# Patient Record
Sex: Female | Born: 1965 | Race: White | Hispanic: No | Marital: Married | Smoking: Current every day smoker
Health system: Southern US, Community
[De-identification: ages and names within clinical notes are randomized; demographics above are authoritative.]

## PROBLEM LIST (undated history)

## (undated) DIAGNOSIS — H269 Unspecified cataract: Secondary | ICD-10-CM

## (undated) DIAGNOSIS — Z720 Tobacco use: Secondary | ICD-10-CM

## (undated) DIAGNOSIS — I10 Essential (primary) hypertension: Secondary | ICD-10-CM

## (undated) DIAGNOSIS — Z86010 Personal history of colonic polyps: Principal | ICD-10-CM

## (undated) HISTORY — PX: OTHER SURGICAL HISTORY: SHX169

## (undated) HISTORY — DX: Essential (primary) hypertension: I10

## (undated) HISTORY — DX: Unspecified cataract: H26.9

## (undated) HISTORY — DX: Tobacco use: Z72.0

## (undated) HISTORY — DX: Personal history of colonic polyps: Z86.010

---

## 1999-08-27 ENCOUNTER — Other Ambulatory Visit: Admission: RE | Admit: 1999-08-27 | Discharge: 1999-08-27 | Payer: Self-pay | Admitting: Gynecology

## 2007-06-05 ENCOUNTER — Other Ambulatory Visit: Admission: RE | Admit: 2007-06-05 | Discharge: 2007-06-05 | Payer: Self-pay | Admitting: Gynecology

## 2007-06-07 ENCOUNTER — Ambulatory Visit (HOSPITAL_COMMUNITY): Admission: RE | Admit: 2007-06-07 | Discharge: 2007-06-07 | Payer: Self-pay | Admitting: Gynecology

## 2007-06-11 ENCOUNTER — Encounter: Admission: RE | Admit: 2007-06-11 | Discharge: 2007-06-11 | Payer: Self-pay | Admitting: Gynecology

## 2007-11-20 ENCOUNTER — Encounter: Admission: RE | Admit: 2007-11-20 | Discharge: 2007-11-20 | Payer: Self-pay | Admitting: Gynecology

## 2009-07-01 IMAGING — MG MM DIAGNOSTIC UNILATERAL L
4 series · 4 of 4 positions shown · non-contrast
Comparison: none

DG DIAGNOSTIC UNILATERAL L
CC and MLO view(s) were taken of the left breast.
Technologist: Hoossen Dwa

DIGITAL UNILATERAL LEFT DIAGNOSTIC MAMMOGRAM WITH CAD:
CLINICAL DATA: 41-year-old for six-month follow-up of calcifications left breast.

[L CC (1 of 2)]
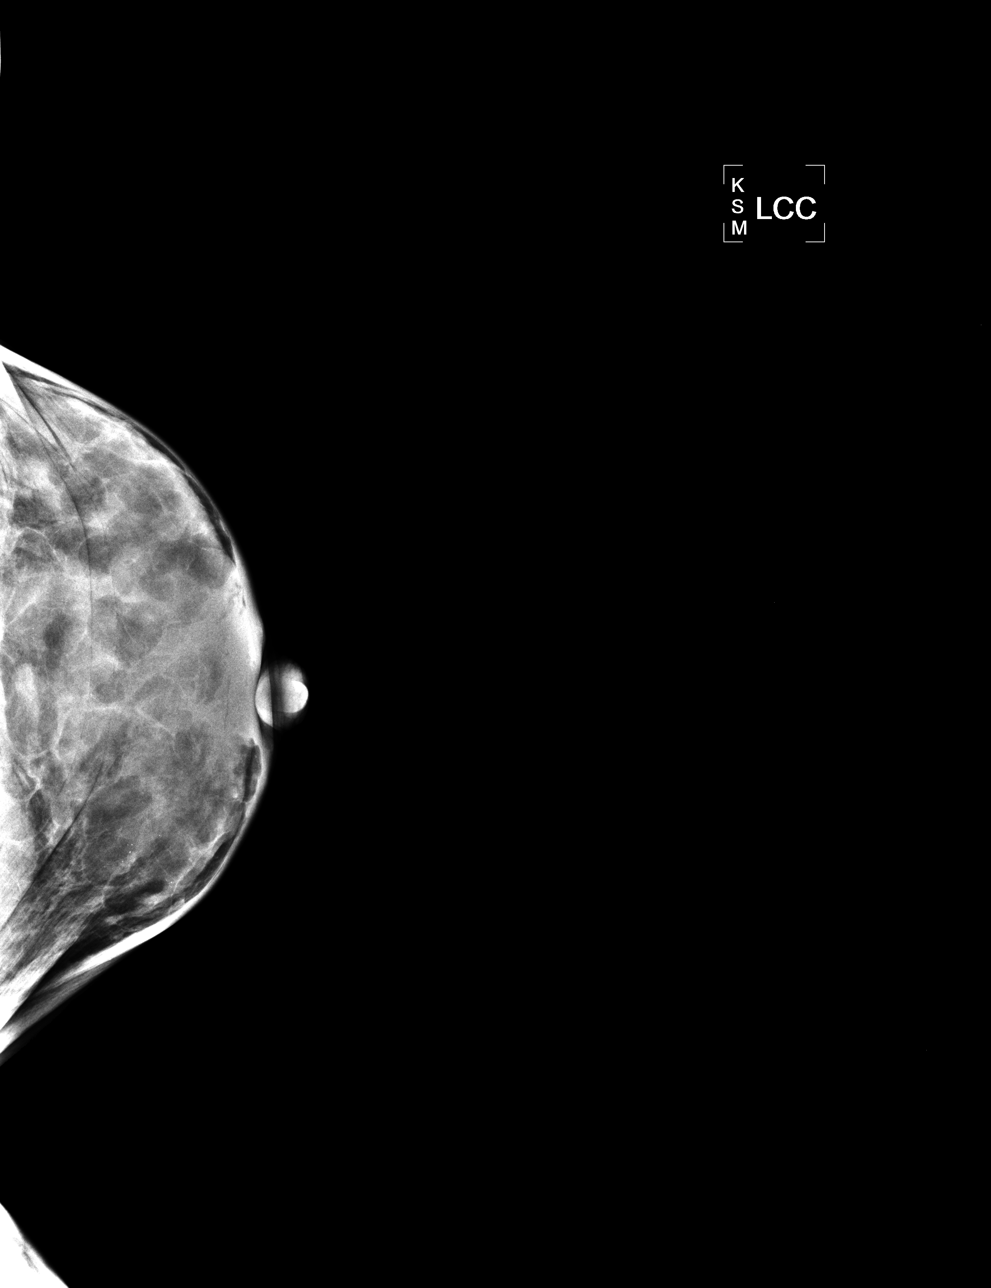

[L MLO]
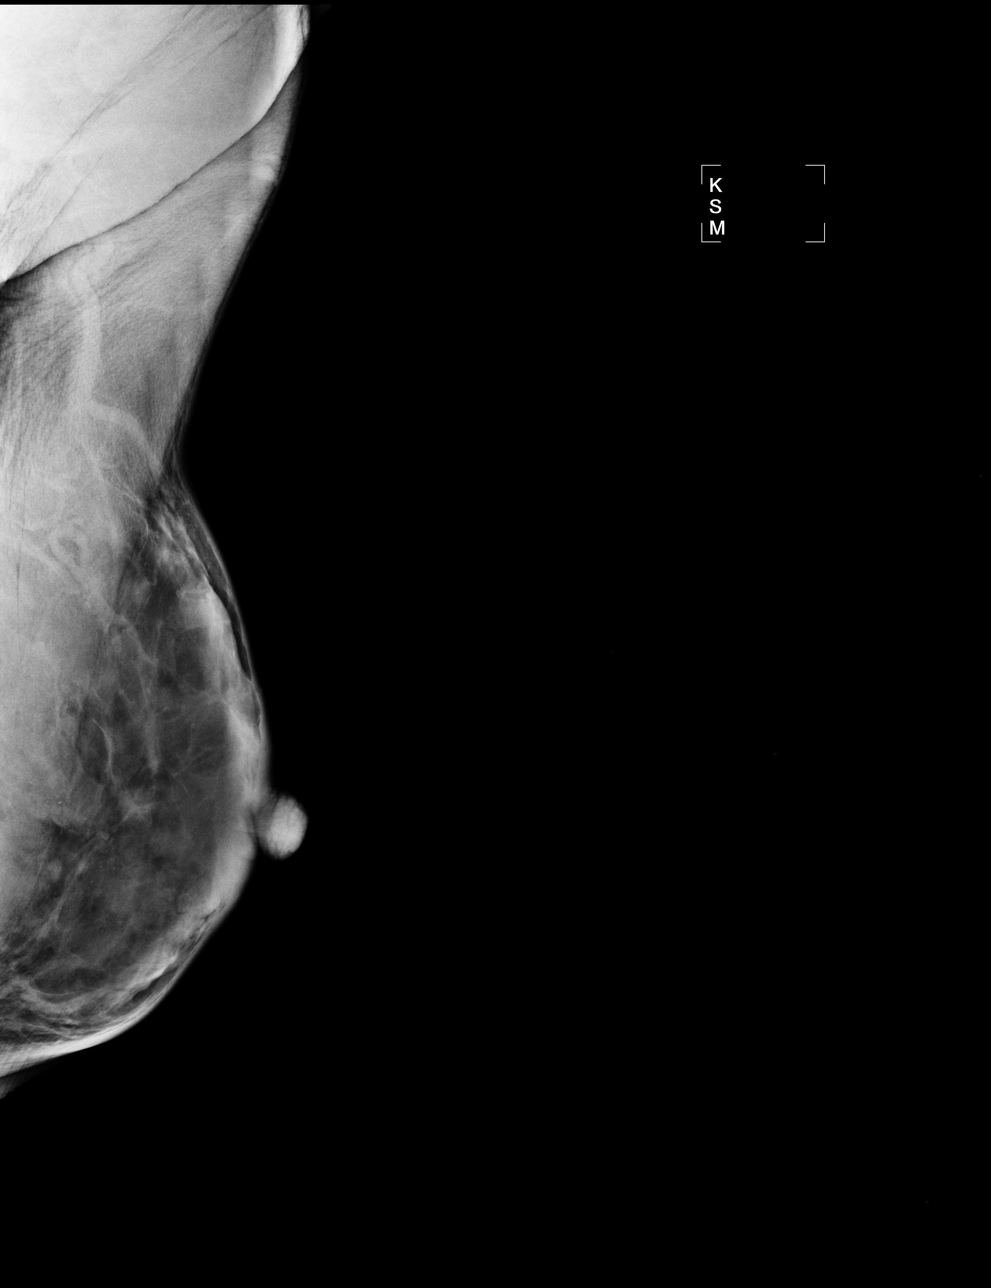

[L CC (2 of 2)]
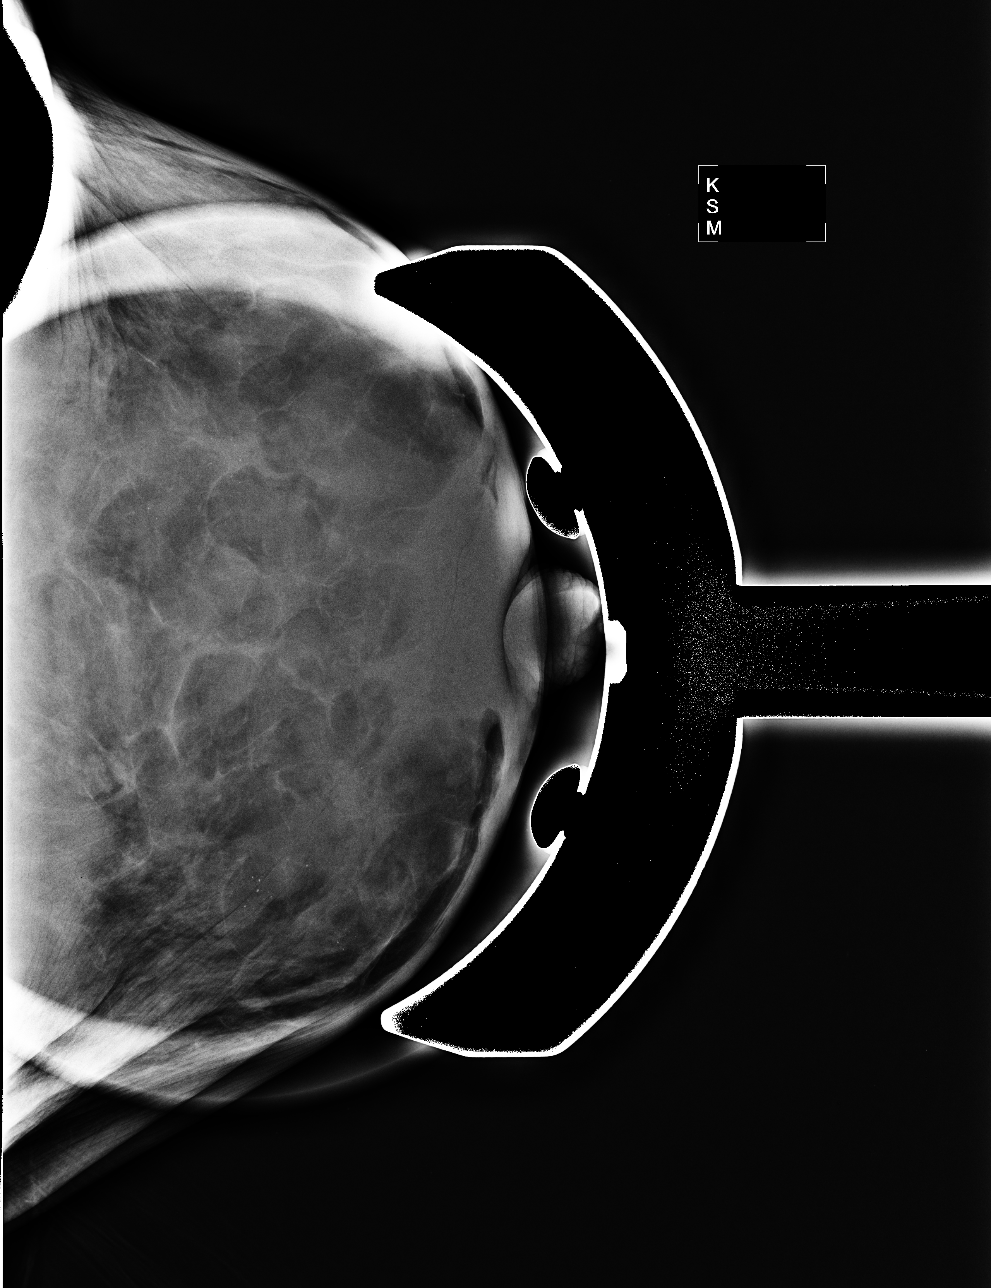

[L ML]
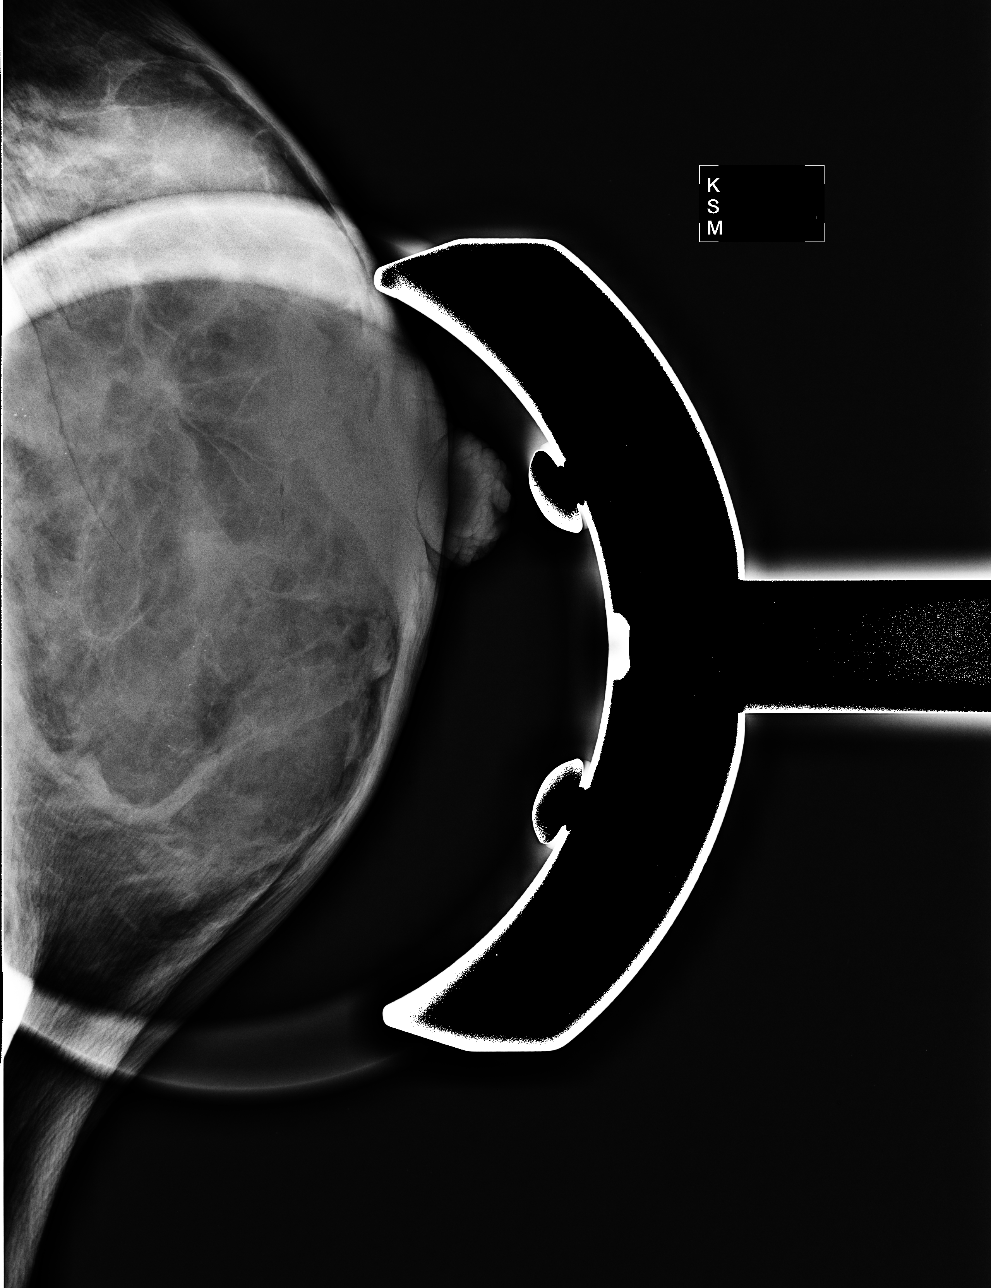

[4 of 4 positions shown; findings below may reference images not displayed]

Standard and magnified views were performed of the left breast showing extremely dense 
fibroglandular tissue.  Magnified views are performed of calcifications in the medial aspect of the
breast.  These calcifications are scattered and benign in appearance, stable since the prior 
study.  No suspicious calcifications or distribution of calcifications identified.
IMPRESSION: Calcifications on the left are consistent with benign process. Bilateral diagnostic is suggested in
May 2008 to complete the patient's full year of follow-up.

ASSESSMENT: Probably benign - BI-RADS 3

ANALYZED BY COMPUTER AIDED DETECTION. ,

## 2019-01-30 ENCOUNTER — Encounter: Payer: Self-pay | Admitting: Internal Medicine

## 2019-02-01 ENCOUNTER — Ambulatory Visit (AMBULATORY_SURGERY_CENTER): Payer: Self-pay | Admitting: *Deleted

## 2019-02-01 ENCOUNTER — Other Ambulatory Visit: Payer: Self-pay

## 2019-02-01 VITALS — Ht 64.0 in | Wt 112.0 lb

## 2019-02-01 DIAGNOSIS — Z1211 Encounter for screening for malignant neoplasm of colon: Secondary | ICD-10-CM

## 2019-02-01 NOTE — Progress Notes (Signed)
No egg or soy allergy known to patient  No issues with past sedation with any surgeries  or procedures, no intubation problems  No diet pills per patient No home 02 use per patient  No blood thinners per patient  Pt states she does have some problems with constipation , takes exlax and has results, takes once weekly. Patient stating she will take an exlax on Wednesday prior to Friday procedure. No A fib or A flutter  EMMI video offered and decline

## 2019-02-15 ENCOUNTER — Ambulatory Visit (AMBULATORY_SURGERY_CENTER): Payer: PRIVATE HEALTH INSURANCE | Admitting: Internal Medicine

## 2019-02-15 ENCOUNTER — Encounter: Payer: Self-pay | Admitting: Internal Medicine

## 2019-02-15 ENCOUNTER — Other Ambulatory Visit: Payer: Self-pay

## 2019-02-15 VITALS — BP 149/73 | HR 58 | Temp 99.3°F | Resp 16 | Ht 64.0 in | Wt 112.0 lb

## 2019-02-15 DIAGNOSIS — Z1211 Encounter for screening for malignant neoplasm of colon: Secondary | ICD-10-CM

## 2019-02-15 DIAGNOSIS — D123 Benign neoplasm of transverse colon: Secondary | ICD-10-CM | POA: Diagnosis not present

## 2019-02-15 MED ORDER — SODIUM CHLORIDE 0.9 % IV SOLN
500.0000 mL | Freq: Once | INTRAVENOUS | Status: DC
Start: 2019-02-15 — End: 2019-02-15

## 2019-02-15 NOTE — Progress Notes (Signed)
A/ox3, pleased with MAC, report to RN 

## 2019-02-15 NOTE — Op Note (Signed)
[employer] Patient Name: Bridget Hawkins Procedure Date: 02/15/2019 1:33 PM MRN: [id number] Endoscopist: Gatha Mayer , MD Age: 53 Referring MD:  Date of Birth: 11-29-1966 Gender: Female Account #: [id number] Procedure:                Colonoscopy Indications:              Screening for colorectal malignant neoplasm, This                            is the patient's first colonoscopy Medicines:                Propofol per Anesthesia, Monitored Anesthesia Care Procedure:                Pre-Anesthesia Assessment:                           - Prior to the procedure, a History and Physical                            was performed, and patient medications and                            allergies were reviewed. The patient's tolerance of                            previous anesthesia was also reviewed. The risks                            and benefits of the procedure and the sedation                            options and risks were discussed with the patient.                            All questions were answered, and informed consent                            was obtained. Prior Anticoagulants: The patient has                            taken no previous anticoagulant or antiplatelet                            agents. ASA Grade Assessment: II - A patient with                            mild systemic disease. After reviewing the risks                            and benefits, the patient was deemed in                            satisfactory condition to undergo the procedure.  After obtaining informed consent, the colonoscope                            was passed under direct vision. Throughout the                            procedure, the patient's blood pressure, pulse, and                            oxygen saturations were monitored continuously. The                            Model PCF-[id number] scope was introduced   through the anus and advanced to the the cecum,                            identified by appendiceal orifice and ileocecal                            valve. The colonoscopy was performed without                            difficulty. The patient tolerated the procedure                            well. The quality of the bowel preparation was                            good. The bowel preparation used was Miralax. The                            ileocecal valve, appendiceal orifice, and rectum                            were photographed. Scope In: 1:40:25 PM Scope Out: 1:55:53 PM Scope Withdrawal Time: 0 hours 11 minutes 46 seconds  Total Procedure Duration: 0 hours 15 minutes 28 seconds  Findings:                 The perianal and digital rectal examinations were                            normal.                           A 3 mm polyp was found in the transverse colon. The                            polyp was sessile. The polyp was removed with a                            cold snare. Resection and retrieval were complete.                            Verification of patient identification  for the                            specimen was done. Estimated blood loss was minimal.                           The exam was otherwise without abnormality on                            direct and retroflexion views. Complications:            No immediate complications. Estimated Blood Loss:     Estimated blood loss was minimal. Impression:               - One 3 mm polyp in the transverse colon, removed                            with a cold snare. Resected and retrieved.                           - The examination was otherwise normal on direct                            and retroflexion views. Recommendation:           - Patient has a contact number available for                            emergencies. The signs and symptoms of potential                            delayed complications were discussed  with the                            patient. Return to normal activities tomorrow.                            Written discharge instructions were provided to the                            patient.                           - Resume previous diet.                           - Repeat colonoscopy is recommended. The                            colonoscopy date will be determined after pathology                            results from today's exam become available for                            review. Gatha Mayer, MD 02/15/2019 2:06:07 PM This report  has been signed electronically.

## 2019-02-15 NOTE — Progress Notes (Signed)
Called to room to assist during endoscopic procedure.  Patient ID and intended procedure confirmed with present staff. Received instructions for my participation in the procedure from the performing physician.  

## 2019-02-15 NOTE — Progress Notes (Signed)
Pt's states no medical or surgical changes since previsit or office visit. 

## 2019-02-15 NOTE — Patient Instructions (Addendum)
I found and removed one tiny polyp.  I will let you know pathology results and when to have another routine colonoscopy by mail and/or My Chart.  I appreciate the opportunity to care for you. Gatha Mayer, MD, FACG YOU HAD AN ENDOSCOPIC PROCEDURE TODAY AT [facility]:   Refer to the procedure report that was given to you for any specific questions about what was found during the examination.  If the procedure report does not answer your questions, please call your gastroenterologist to clarify.  If you requested that your care partner not be given the details of your procedure findings, then the procedure report has been included in a sealed envelope for you to review at your convenience later.  YOU SHOULD EXPECT: Some feelings of bloating in the abdomen. Passage of more gas than usual.  Walking can help get rid of the air that was put into your GI tract during the procedure and reduce the bloating. If you had a lower endoscopy (such as a colonoscopy or flexible sigmoidoscopy) you may notice spotting of blood in your stool or on the toilet paper. If you underwent a bowel prep for your procedure, you may not have a normal bowel movement for a few days.  Please Note:  You might notice some irritation and congestion in your nose or some drainage.  This is from the oxygen used during your procedure.  There is no need for concern and it should clear up in a day or so.  SYMPTOMS TO REPORT IMMEDIATELY:   Following lower endoscopy (colonoscopy or flexible sigmoidoscopy):  Excessive amounts of blood in the stool  Significant tenderness or worsening of abdominal pains  Swelling of the abdomen that is new, acute  Fever of 100F or higher   Following upper endoscopy (EGD)  Vomiting of blood or coffee ground material  New chest pain or pain under the shoulder blades  Painful or persistently difficult swallowing  New shortness of breath  Fever of 100F or higher  Black,  tarry-looking stools  For urgent or emergent issues, a gastroenterologist can be reached at any hour by calling [phone].   DIET:  We do recommend a small meal at first, but then you may proceed to your regular diet.  Drink plenty of fluids but you should avoid alcoholic beverages for 24 hours.  ACTIVITY:  You should plan to take it easy for the rest of today and you should NOT DRIVE or use heavy machinery until tomorrow (because of the sedation medicines used during the test).    FOLLOW UP: Our staff will call the number listed on your records the next business day following your procedure to check on you and address any questions or concerns that you may have regarding the information given to you following your procedure. If we do not reach you, we will leave a message.  However, if you are feeling well and you are not experiencing any problems, there is no need to return our call.  We will assume that you have returned to your regular daily activities without incident.  If any biopsies were taken you will be contacted by phone or by letter within the next 1-3 weeks.  Please call us at [phone] if you have not heard about the biopsies in 3 weeks.    SIGNATURES/CONFIDENTIALITY: You and/or your care partner have signed paperwork which will be entered into your electronic medical record.  These signatures attest to the fact that that  the information above on your After Visit Summary has been reviewed and is understood.  Full responsibility of the confidentiality of this discharge information lies with you and/or your care-partner.YOU HAD AN ENDOSCOPIC PROCEDURE TODAY AT [facility]:   Refer to the procedure report that was given to you for any specific questions about what was found during the examination.  If the procedure report does not answer your questions, please call your gastroenterologist to clarify.  If you requested that your care partner not be given the  details of your procedure findings, then the procedure report has been included in a sealed envelope for you to review at your convenience later.  YOU SHOULD EXPECT: Some feelings of bloating in the abdomen. Passage of more gas than usual.  Walking can help get rid of the air that was put into your GI tract during the procedure and reduce the bloating. If you had a lower endoscopy (such as a colonoscopy or flexible sigmoidoscopy) you may notice spotting of blood in your stool or on the toilet paper. If you underwent a bowel prep for your procedure, you may not have a normal bowel movement for a few days.  Please Note:  You might notice some irritation and congestion in your nose or some drainage.  This is from the oxygen used during your procedure.  There is no need for concern and it should clear up in a day or so.  SYMPTOMS TO REPORT IMMEDIATELY:   Following lower endoscopy (colonoscopy or flexible sigmoidoscopy):  Excessive amounts of blood in the stool  Significant tenderness or worsening of abdominal pains  Swelling of the abdomen that is new, acute  Fever of 100F or higher   Following upper endoscopy (EGD)  Vomiting of blood or coffee ground material  New chest pain or pain under the shoulder blades  Painful or persistently difficult swallowing  New shortness of breath  Fever of 100F or higher  Black, tarry-looking stools  For urgent or emergent issues, a gastroenterologist can be reached at any hour by calling [phone].   DIET:  We do recommend a small meal at first, but then you may proceed to your regular diet.  Drink plenty of fluids but you should avoid alcoholic beverages for 24 hours.  ACTIVITY:  You should plan to take it easy for the rest of today and you should NOT DRIVE or use heavy machinery until tomorrow (because of the sedation medicines used during the test).    FOLLOW UP: Our staff will call the number listed on your records the next business day  following your procedure to check on you and address any questions or concerns that you may have regarding the information given to you following your procedure. If we do not reach you, we will leave a message.  However, if you are feeling well and you are not experiencing any problems, there is no need to return our call.  We will assume that you have returned to your regular daily activities without incident.  If any biopsies were taken you will be contacted by phone or by letter within the next 1-3 weeks.  Please call us at [phone] if you have not heard about the biopsies in 3 weeks.    SIGNATURES/CONFIDENTIALITY: You and/or your care partner have signed paperwork which will be entered into your electronic medical record.  These signatures attest to the fact that that the information above on your After Visit Summary has been reviewed and  is understood.  Full responsibility of the confidentiality of this discharge information lies with you and/or your care-partner.  Polyp information given

## 2019-02-18 ENCOUNTER — Telehealth: Payer: Self-pay

## 2019-02-18 NOTE — Telephone Encounter (Signed)
Left message on f/u call 

## 2019-02-22 ENCOUNTER — Encounter: Payer: Self-pay | Admitting: Internal Medicine

## 2019-02-22 DIAGNOSIS — Z86010 Personal history of colonic polyps: Secondary | ICD-10-CM

## 2019-02-22 DIAGNOSIS — Z860101 Personal history of adenomatous and serrated colon polyps: Secondary | ICD-10-CM | POA: Insufficient documentation

## 2019-02-22 HISTORY — DX: Personal history of adenomatous and serrated colon polyps: Z86.0101

## 2019-02-22 HISTORY — DX: Personal history of colonic polyps: Z86.010

## 2019-02-22 NOTE — Progress Notes (Signed)
Diminutive adenoma Recall 2027

## 2020-02-21 ENCOUNTER — Ambulatory Visit: Payer: PRIVATE HEALTH INSURANCE

## 2023-06-21 ENCOUNTER — Other Ambulatory Visit: Payer: Self-pay | Admitting: Obstetrics and Gynecology

## 2023-06-21 DIAGNOSIS — Z720 Tobacco use: Secondary | ICD-10-CM

## 2023-08-02 ENCOUNTER — Other Ambulatory Visit: Payer: PRIVATE HEALTH INSURANCE

## 2024-05-01 ENCOUNTER — Other Ambulatory Visit (HOSPITAL_COMMUNITY): Payer: Self-pay | Admitting: Obstetrics and Gynecology

## 2024-05-01 DIAGNOSIS — Z720 Tobacco use: Secondary | ICD-10-CM

## 2024-05-07 ENCOUNTER — Other Ambulatory Visit: Payer: Self-pay | Admitting: Obstetrics and Gynecology

## 2024-05-07 DIAGNOSIS — R928 Other abnormal and inconclusive findings on diagnostic imaging of breast: Secondary | ICD-10-CM

## 2024-05-16 ENCOUNTER — Ambulatory Visit
Admission: RE | Admit: 2024-05-16 | Discharge: 2024-05-16 | Disposition: A | Discharge status: Home / Self Care | Payer: PRIVATE HEALTH INSURANCE | Source: Ambulatory Visit | Attending: Obstetrics and Gynecology | Admitting: Obstetrics and Gynecology

## 2024-05-16 DIAGNOSIS — R928 Other abnormal and inconclusive findings on diagnostic imaging of breast: Secondary | ICD-10-CM

## 2024-05-21 ENCOUNTER — Encounter (HOSPITAL_COMMUNITY): Payer: Self-pay

## 2024-05-21 ENCOUNTER — Ambulatory Visit (HOSPITAL_COMMUNITY)
Admission: RE | Admit: 2024-05-21 | Discharge: 2024-05-21 | Disposition: A | Discharge status: Home / Self Care | Payer: PRIVATE HEALTH INSURANCE | Source: Ambulatory Visit | Attending: Obstetrics and Gynecology | Admitting: Obstetrics and Gynecology

## 2024-05-21 DIAGNOSIS — Z720 Tobacco use: Secondary | ICD-10-CM | POA: Insufficient documentation

## 2024-09-10 ENCOUNTER — Ambulatory Visit: Attending: Cardiovascular Disease | Admitting: Cardiovascular Disease

## 2024-09-10 ENCOUNTER — Encounter: Payer: Self-pay | Admitting: Cardiovascular Disease

## 2024-09-10 VITALS — BP 110/70 | HR 71 | Ht 64.0 in | Wt 107.0 lb

## 2024-09-10 DIAGNOSIS — I700 Atherosclerosis of aorta: Secondary | ICD-10-CM

## 2024-09-10 DIAGNOSIS — I2510 Atherosclerotic heart disease of native coronary artery without angina pectoris: Secondary | ICD-10-CM

## 2024-09-10 DIAGNOSIS — I10 Essential (primary) hypertension: Secondary | ICD-10-CM | POA: Diagnosis not present

## 2024-09-10 DIAGNOSIS — F17200 Nicotine dependence, unspecified, uncomplicated: Secondary | ICD-10-CM | POA: Diagnosis not present

## 2024-09-10 NOTE — Patient Instructions (Signed)
 Medication Instructions:  No changes *If you need a refill on your cardiac medications before your next appointment, please call your pharmacy*  Lab Work: None ordered If you have labs (blood work) drawn today and your tests are completely normal, you will receive your results only by: MyChart Message (if you have MyChart) OR A paper copy in the mail If you have any lab test that is abnormal or we need to change your treatment, we will call you to review the results.  Testing/Procedures: None ordered  Follow-Up: At [facility], you and your health needs are our priority.  As part of our continuing mission to provide you with exceptional heart care, our providers are all part of one team.  This team includes your primary Cardiologist (physician) and Advanced Practice Providers or APPs (Physician Assistants and Nurse Practitioners) who all work together to provide you with the care you need, when you need it.  Your next appointment:    Follow up as needed  Provider:   Dr Francyne  We recommend signing up for the patient portal called MyChart.  Sign up information is provided on this After Visit Summary.  MyChart is used to connect with patients for Virtual Visits (Telemedicine).  Patients are able to view lab/test results, encounter notes, upcoming appointments, etc.  Non-urgent messages can be sent to your provider as well.   To learn more about what you can do with MyChart, go to [email or web address].

## 2024-09-10 NOTE — Progress Notes (Signed)
  Cardiology Office Note   Date:  09/10/2024  ID:  Bridget Hawkins, DOB December 14, 1965, MRN [id number] PCP: Venancio Pock, PA-C  [facility] Providers Cardiologist:  None     History of Present Illness Bridget Hawkins is a 58 y.o. female without chronic medical problems other than well treated high blood pressure referred in consultation for elevated coronary calcium score.  She is very physically active and fit and has no cardiovascular complaints.  The patient specifically denies any chest pain at rest or with exertion, dyspnea at rest or with exertion, orthopnea, paroxysmal nocturnal dyspnea, syncope, palpitations, focal neurological deficits, intermittent claudication, lower extremity edema, unexplained weight gain, cough, hemoptysis or wheezing.  In June of this year she underwent a coronary calcium score, modestly elevated at 25.6 (82nd percentile).  There is also mild aortic atherosclerosis but the aorta appears normal in caliber.  She only started smoking about 10 years ago and smokes roughly a pack of cigarettes a day.  Her father had a pacemaker and her mother had diabetes but neither they nor any of her relatives have had early onset coronary or other vascular problems.  She believes one of her aunts did have a stroke, but not at a young age.  She does not have diabetes mellitus.  Her recent lipid profile shows LDL cholesterol of 91 and HDL cholesterol of 84 with normal triglycerides and normal hemoglobin A1c.  She has normal renal function.  ROS: No other complaints.  Studies Reviewed EKG Interpretation Date/Time:  Tuesday September 10 2024 11:13:05 EDT Ventricular Rate:  71 PR Interval:  144 QRS Duration:  78 QT Interval:  432 QTC Calculation: 469 R Axis:   98  Text Interpretation: Normal sinus rhythm Rightward axis No previous ECGs available Confirmed by Malky Rudzinski ([id number]) on 09/10/2024 11:38:32 AM     Risk Assessment/Calculations           Physical Exam VS:  BP  110/70   Pulse 71   Ht 5' 4 (1.626 m)   Wt 107 lb (48.5 kg)   SpO2 99%   BMI 18.37 kg/m        Wt Readings from Last 3 Encounters:  09/10/24 107 lb (48.5 kg)  02/15/19 112 lb (50.8 kg)  02/01/19 112 lb (50.8 kg)    GEN: Well nourished, well developed in no acute distress.  Very thin and appears fit NECK: No JVD; No carotid bruits CARDIAC: RRR, no murmurs, rubs, gallops RESPIRATORY:  Clear to auscultation without rales, wheezing or rhonchi  ABDOMEN: Soft, non-tender, non-distended EXTREMITIES:  No edema; No deformity   ASSESSMENT AND PLAN  Elevated coronary calcium score: Moderately elevated coronary calcium score in an asymptomatic 58 year old woman with a 10-pack-year history of smoking, high blood pressure, but otherwise without significant coronary/vascular risk factors.  Based on conventional risk factors her 10-year [place] score risk is 2.6%.  Even with the addition of the elevated coronary calcium score her 10-year risk is only 3.6%.  She has an excellent HDL cholesterol and an LDL cholesterol that is less than 100.  At this point I do not think taking lipid-lowering medications is indicated. Smoking:  Strongly recommend smoking cessation as the most important intervention to prevent progression of atherosclerotic disease and future complications. HTN:  Blood pressure is well-controlled on the current medications, continue them.       Dispo: Strongly recommend smoking cessation.  Follow-up as needed.  Signed, Jerel Balding, MD
